# Patient Record
Sex: Female | Born: 1940 | Race: White | Hispanic: No | Marital: Single | Smoking: Never smoker
Health system: Southern US, Community
[De-identification: ages and names within clinical notes are randomized; demographics above are authoritative.]

## PROBLEM LIST (undated history)

## (undated) DIAGNOSIS — E079 Disorder of thyroid, unspecified: Secondary | ICD-10-CM

## (undated) DIAGNOSIS — E7800 Pure hypercholesterolemia, unspecified: Secondary | ICD-10-CM

## (undated) DIAGNOSIS — I10 Essential (primary) hypertension: Secondary | ICD-10-CM

---

## 2013-02-20 ENCOUNTER — Emergency Department: Payer: Self-pay | Admitting: Emergency Medicine

## 2013-09-06 ENCOUNTER — Ambulatory Visit: Payer: Self-pay | Admitting: Nephrology

## 2017-01-23 ENCOUNTER — Encounter: Payer: Self-pay | Admitting: *Deleted

## 2017-01-23 ENCOUNTER — Emergency Department
Admission: EM | Admit: 2017-01-23 | Discharge: 2017-01-23 | Disposition: A | Discharge status: Home / Self Care | Payer: Medicare Other | Attending: Emergency Medicine | Admitting: Emergency Medicine

## 2017-01-23 DIAGNOSIS — R030 Elevated blood-pressure reading, without diagnosis of hypertension: Secondary | ICD-10-CM

## 2017-01-23 DIAGNOSIS — Z0130 Encounter for examination of blood pressure without abnormal findings: Secondary | ICD-10-CM | POA: Diagnosis not present

## 2017-01-23 DIAGNOSIS — I10 Essential (primary) hypertension: Secondary | ICD-10-CM | POA: Insufficient documentation

## 2017-01-23 LAB — CBC
HCT: 43.3 % (ref 35.0–47.0)
Hemoglobin: 14.7 g/dL (ref 12.0–16.0)
MCH: 30.3 pg (ref 26.0–34.0)
MCHC: 34 g/dL (ref 32.0–36.0)
MCV: 88.9 fL (ref 80.0–100.0)
PLATELETS: 244 10*3/uL (ref 150–440)
RBC: 4.87 MIL/uL (ref 3.80–5.20)
RDW: 13.7 % (ref 11.5–14.5)
WBC: 7 10*3/uL (ref 3.6–11.0)

## 2017-01-23 LAB — BASIC METABOLIC PANEL
ANION GAP: 10 (ref 5–15)
BUN: 20 mg/dL (ref 6–20)
CALCIUM: 9.7 mg/dL (ref 8.9–10.3)
CO2: 28 mmol/L (ref 22–32)
CREATININE: 1.1 mg/dL — AB (ref 0.44–1.00)
Chloride: 95 mmol/L — ABNORMAL LOW (ref 101–111)
GFR, EST AFRICAN AMERICAN: 55 mL/min — AB (ref >60)
GFR, EST NON AFRICAN AMERICAN: 48 mL/min — AB (ref >60)
GLUCOSE: 170 mg/dL — AB (ref 65–99)
Potassium: 4.8 mmol/L (ref 3.5–5.1)
Sodium: 133 mmol/L — ABNORMAL LOW (ref 135–145)

## 2017-01-23 LAB — TROPONIN I

## 2017-01-23 NOTE — ED Triage Notes (Signed)
Patient states that she has a history of hypertension and has been taking her medications as prescribed. Patient states that over the past 2 days her bp has been 160's/90's.

## 2017-01-23 NOTE — ED Provider Notes (Signed)
[facility] Emergency Department Provider Note       Time seen: ----------------------------------------- 9:17 PM on 01/23/2017 -----------------------------------------     I have reviewed the triage vital signs and the nursing notes.   HISTORY   Chief Complaint Hypertension    HPI Tammy Morgan is a 76 y.o. female who presents to the ED for high blood pressure since chest today. Patient has been taking her blood pressure medications as prescribed. Patient states she is not sure why she took her blood pressure but when she did at home it was noted to be elevated. She denies fevers, chills, chest pain, shortness of breath, headache, vomiting or diarrhea.   No past medical history on file.  There are no active problems to display for this patient.   No past surgical history on file.  Allergies Penicillins and Sulfur  Social History Social History  Substance Use Topics  . Smoking status: Never Smoker  . Smokeless tobacco: Never Used  . Alcohol use No    Review of Systems Constitutional: Negative for fever. Eyes: Negative for vision changes ENT:  Negative for congestion, sore throat Cardiovascular: Negative for chest pain. Respiratory: Negative for shortness of breath. Gastrointestinal: Negative for abdominal pain, vomiting and diarrhea. Genitourinary: Negative for dysuria. Musculoskeletal: Negative for back pain. Skin: Negative for rash. Neurological: Negative for headaches, focal weakness or numbness.  All systems negative/normal/unremarkable except as stated in the HPI  ____________________________________________   PHYSICAL EXAM:  VITAL SIGNS: ED Triage Vitals [01/23/17 1927]  Enc Vitals Group     BP (!) 177/57     Pulse Rate 67     Resp 18     Temp 98.3 F (36.8 C)     Temp Source Oral     SpO2 98 %     Weight 167 lb (75.8 kg)     Height 5\' 3"  (1.6 m)     Head Circumference      Peak Flow      Pain Score      Pain  Loc      Pain Edu?      Excl. in GC?     Constitutional: Alert and oriented. Well appearing and in no distress. Eyes: Conjunctivae are normal. Normal extraocular movements. ENT   Head: Normocephalic and atraumatic.   Nose: No congestion/rhinnorhea.   Mouth/Throat: Mucous membranes are moist.   Neck: No stridor. Cardiovascular: Normal rate, regular rhythm. No murmurs, rubs, or gallops. Respiratory: Normal respiratory effort without tachypnea nor retractions. Breath sounds are clear and equal bilaterally. No wheezes/rales/rhonchi. Gastrointestinal: Soft and nontender. Normal bowel sounds Musculoskeletal: Nontender with normal range of motion in extremities. No lower extremity tenderness nor edema. Neurologic:  Normal speech and language. No gross focal neurologic deficits are appreciated.  Skin:  Skin is warm, dry and intact. No rash noted. Psychiatric: Mood and affect are normal. Speech and behavior are normal.  ____________________________________________  EKG: Interpreted by me. Sinus bradycardia with rate of 59 bpm, normal PR interval, normal QRS, normal QT.  ____________________________________________  ED COURSE:  Pertinent labs & imaging results that were available during my care of the patient were reviewed by me and considered in my medical decision making (see chart for details). Patient presents for hypertension, we will assess with labs and imaging as indicated.   Procedures ____________________________________________   LABS (pertinent positives/negatives)  Labs Reviewed  BASIC METABOLIC PANEL - Abnormal; Notable for the following:       Result Value   Sodium  133 (*)    Chloride 95 (*)    Glucose, Bld 170 (*)    Creatinine, Ser 1.10 (*)    GFR calc non Af Amer 48 (*)    GFR calc Af Amer 55 (*)    All other components within normal limits  CBC  TROPONIN I   ____________________________________________  FINAL ASSESSMENT AND  PLAN  Hypertension  Plan: Patient's labs and imaging were dictated above. Patient had presented for high blood pressure which may be blood pressure cuff related. Currently her blood pressure is unremarkable. We will encourage daily blood pressure checks and follow up with her doctor in 1 week.   Emily FilbertWilliams, Therman Hughlett E, MD   Note: This note was generated in part or whole with voice recognition software. Voice recognition is usually quite accurate but there are transcription errors that can and very often do occur. I apologize for any typographical errors that were not detected and corrected.     Emily FilbertWilliams, Asya Derryberry E, MD 01/23/17 2154

## 2017-01-23 NOTE — ED Notes (Signed)
ED Provider at bedside. 

## 2017-01-23 NOTE — ED Triage Notes (Signed)
Pt reports elevated blood pressure since yesterday.  Pt taking blood pressure meds.  Pt denies h/a, dizziness.  No chest pain or sob.  No n/v/d.  Pt alert   Speech clear.

## 2019-12-30 ENCOUNTER — Emergency Department: Payer: Medicare PPO

## 2019-12-30 ENCOUNTER — Other Ambulatory Visit: Payer: Self-pay

## 2019-12-30 ENCOUNTER — Encounter: Payer: Self-pay | Admitting: Emergency Medicine

## 2019-12-30 DIAGNOSIS — I10 Essential (primary) hypertension: Secondary | ICD-10-CM | POA: Diagnosis not present

## 2019-12-30 DIAGNOSIS — N390 Urinary tract infection, site not specified: Secondary | ICD-10-CM | POA: Insufficient documentation

## 2019-12-30 LAB — CBC
HCT: 40.6 % (ref 36.0–46.0)
Hemoglobin: 14.1 g/dL (ref 12.0–15.0)
MCH: 30.6 pg (ref 26.0–34.0)
MCHC: 34.7 g/dL (ref 30.0–36.0)
MCV: 88.1 fL (ref 80.0–100.0)
Platelets: 230 10*3/uL (ref 150–400)
RBC: 4.61 MIL/uL (ref 3.87–5.11)
RDW: 12.5 % (ref 11.5–15.5)
WBC: 5.3 10*3/uL (ref 4.0–10.5)
nRBC: 0 % (ref 0.0–0.2)

## 2019-12-30 LAB — BASIC METABOLIC PANEL
Anion gap: 10 (ref 5–15)
BUN: 15 mg/dL (ref 8–23)
CO2: 28 mmol/L (ref 22–32)
Calcium: 9.3 mg/dL (ref 8.9–10.3)
Chloride: 100 mmol/L (ref 98–111)
Creatinine, Ser: 1.1 mg/dL — ABNORMAL HIGH (ref 0.44–1.00)
GFR calc Af Amer: 55 mL/min — ABNORMAL LOW (ref >60)
GFR calc non Af Amer: 48 mL/min — ABNORMAL LOW (ref >60)
Glucose, Bld: 147 mg/dL — ABNORMAL HIGH (ref 70–99)
Potassium: 4 mmol/L (ref 3.5–5.1)
Sodium: 138 mmol/L (ref 135–145)

## 2019-12-30 LAB — TROPONIN I (HIGH SENSITIVITY): Troponin I (High Sensitivity): 6 ng/L (ref <18)

## 2019-12-30 NOTE — ED Triage Notes (Signed)
Patient states that she checked her blood pressure today and that it was elevated. Patient states that the first time she checked it the systolic was in the 170's. Patient states that she continued to check her blood pressure and her blood pressure was 204/103. Patient denies any chest pain, dizziness or headache.

## 2019-12-31 ENCOUNTER — Emergency Department
Admission: EM | Admit: 2019-12-31 | Discharge: 2019-12-31 | Disposition: A | Discharge status: Home / Self Care | Payer: Medicare PPO | Attending: Emergency Medicine | Admitting: Emergency Medicine

## 2019-12-31 DIAGNOSIS — I159 Secondary hypertension, unspecified: Secondary | ICD-10-CM

## 2019-12-31 DIAGNOSIS — N390 Urinary tract infection, site not specified: Secondary | ICD-10-CM

## 2019-12-31 HISTORY — DX: Disorder of thyroid, unspecified: E07.9

## 2019-12-31 HISTORY — DX: Pure hypercholesterolemia, unspecified: E78.00

## 2019-12-31 HISTORY — DX: Essential (primary) hypertension: I10

## 2019-12-31 LAB — TROPONIN I (HIGH SENSITIVITY)
Troponin I (High Sensitivity): 22 ng/L — ABNORMAL HIGH (ref <18)
Troponin I (High Sensitivity): 22 ng/L — ABNORMAL HIGH (ref <18)

## 2019-12-31 LAB — URINALYSIS, COMPLETE (UACMP) WITH MICROSCOPIC
Bacteria, UA: NONE SEEN
Bilirubin Urine: NEGATIVE
Glucose, UA: NEGATIVE mg/dL
Hgb urine dipstick: NEGATIVE
Ketones, ur: NEGATIVE mg/dL
Nitrite: NEGATIVE
Protein, ur: NEGATIVE mg/dL
Specific Gravity, Urine: 1.01 (ref 1.005–1.030)
pH: 7 (ref 5.0–8.0)

## 2019-12-31 LAB — T4, FREE: Free T4: 1.18 ng/dL — ABNORMAL HIGH (ref 0.61–1.12)

## 2019-12-31 LAB — TSH: TSH: 1.388 u[IU]/mL (ref 0.350–4.500)

## 2019-12-31 MED ORDER — FOSFOMYCIN TROMETHAMINE 3 G PO PACK
3.0000 g | PACK | Freq: Once | ORAL | Status: AC
  Administered 2019-12-31: 3 g via ORAL
  Filled 2019-12-31: qty 3

## 2019-12-31 NOTE — ED Notes (Signed)
Per previous shift multiple attempts were made to collect repeat troponin. This RN called lab to request collect.

## 2019-12-31 NOTE — Discharge Instructions (Addendum)
Stop your current diet plan.  Continue your medications as directed by your doctor.  Return to the ER for worsening symptoms, persistent vomiting, difficulty breathing or other concerns.

## 2019-12-31 NOTE — ED Notes (Signed)
Pt verbalized understanding of discharge instructions. NAD at this time. 

## 2019-12-31 NOTE — ED Notes (Signed)
Labs hand walked to lab by this RN in order to reduce delay of care.

## 2019-12-31 NOTE — ED Provider Notes (Signed)
[facility] Emergency Department Provider Note   ____________________________________________   First MD Initiated Contact with Patient 12/31/19 [phone]     (approximate)  I have reviewed the triage vital signs and the nursing notes.   HISTORY  Chief Complaint Hypertension    HPI Tammy Morgan is a 79 y.o. female who presents to the ED from home with a chief complaint of elevated blood pressure.  Patient has a history of hypertension, hypothyroidism, hyperlipidemia who has noted an increase of her blood pressure yesterday.  3 days ago she started the first part of Redux diet which consists of drinking 70+ ounces of water with salt added.  She felt a mild headache and checked her blood pressure which was 204/103.  Baseline 130-150s/80s.  No change in her antihypertensives recently.  She takes Metoprolol, Cardizem and Diovan.  Denies fever, cough, chest pain, shortness of breath, abdominal pain, nausea, vomiting or dizziness.       Past Medical History:  Diagnosis Date  . Hypercholesteremia   . Hypertension   . Thyroid disease     There are no problems to display for this patient.   History reviewed. No pertinent surgical history.  Prior to Admission medications   Not on File    Allergies Penicillins and Sulfur  No family history on file.  Social History Social History   Tobacco Use  . Smoking status: Never Smoker  . Smokeless tobacco: Never Used  Substance Use Topics  . Alcohol use: Yes  . Drug use: No    Review of Systems  Constitutional: Positive for elevated blood pressure.  No fever/chills Eyes: No visual changes. ENT: No sore throat. Cardiovascular: Denies chest pain. Respiratory: Denies shortness of breath. Gastrointestinal: No abdominal pain.  No nausea, no vomiting.  No diarrhea.  No constipation. Genitourinary: Negative for dysuria. Musculoskeletal: Negative for back pain. Skin: Negative for rash. Neurological: Positive  for headache. Negative for focal weakness or numbness.   ____________________________________________   PHYSICAL EXAM:  VITAL SIGNS: ED Triage Vitals  Enc Vitals Group     BP 12/30/19 2211 (!) 192/88     Pulse Rate 12/30/19 2211 76     Resp 12/30/19 2211 18     Temp 12/30/19 2211 97.7 F (36.5 C)     Temp Source 12/30/19 2211 Oral     SpO2 12/30/19 2211 98 %     Weight 12/30/19 2212 172 lb (78 kg)     Height 12/30/19 2212 5\' 3"  (1.6 m)     Head Circumference --      Peak Flow --      Pain Score 12/30/19 2212 0     Pain Loc --      Pain Edu? --      Excl. in GC? --     Constitutional: Alert and oriented. Well appearing and in no acute distress. Eyes: Conjunctivae are normal. PERRL. EOMI. Head: Atraumatic. Nose: No congestion/rhinnorhea. Mouth/Throat: Mucous membranes are moist.   Neck: No stridor.  No carotid bruits. Cardiovascular: Normal rate, regular rhythm. Grossly normal heart sounds.  Good peripheral circulation. Respiratory: Normal respiratory effort.  No retractions. Lungs CTAB. Gastrointestinal: Soft and nontender to light or deep palpation. No distention. No abdominal bruits. No CVA tenderness. Musculoskeletal: No lower extremity tenderness nor edema.  No joint effusions. Neurologic: Alert and oriented x3.  CN II to XII grossly intact.  Normal speech and language. No gross focal neurologic deficits are appreciated. No gait instability. Skin:  Skin is  warm, dry and intact. No rash noted. Psychiatric: Mood and affect are normal. Speech and behavior are normal.  ____________________________________________   LABS (all labs ordered are listed, but only abnormal results are displayed)  Labs Reviewed  BASIC METABOLIC PANEL - Abnormal; Notable for the following components:      Result Value   Glucose, Bld 147 (*)    Creatinine, Ser 1.10 (*)    GFR calc non Af Amer 48 (*)    GFR calc Af Amer 55 (*)    All other components within normal limits  URINALYSIS,  COMPLETE (UACMP) WITH MICROSCOPIC - Abnormal; Notable for the following components:   Leukocytes,Ua TRACE (*)    All other components within normal limits  T4, FREE - Abnormal; Notable for the following components:   Free T4 1.18 (*)    All other components within normal limits  TROPONIN I (HIGH SENSITIVITY) - Abnormal; Notable for the following components:   Troponin I (High Sensitivity) 22 (*)    All other components within normal limits  CBC  TSH  TROPONIN I (HIGH SENSITIVITY)  TROPONIN I (HIGH SENSITIVITY)   ____________________________________________  EKG  ED ECG REPORT I, Fabian Walder J, the attending physician, personally viewed and interpreted this ECG.   Date: 12/31/2019  EKG Time: 2218  Rate: 61  Rhythm: normal EKG, normal sinus rhythm  Axis: Normal  Intervals:none  ST&T Change: Nonspecific  ____________________________________________  RADIOLOGY  ED MD interpretation: No acute cardiopulmonary process  Official radiology report(s): DG Chest 2 View  Result Date: 12/30/2019 CLINICAL DATA:  Hypertension. EXAM: CHEST - 2 VIEW COMPARISON:  None. FINDINGS: The cardiomediastinal contours are normal. Mild aortic arch atherosclerosis. The lungs are clear. Pulmonary vasculature is normal. No consolidation, pleural effusion, or pneumothorax. No acute osseous abnormalities are seen. IMPRESSION: No acute chest findings. Aortic Atherosclerosis (ICD10-I70.0). Electronically Signed   By: Narda Rutherford M.D.   On: 12/30/2019 23:52    ____________________________________________   PROCEDURES  Procedure(s) performed (including Critical Care):  Procedures   ____________________________________________   INITIAL IMPRESSION / ASSESSMENT AND PLAN / ED COURSE  As part of my medical decision making, I reviewed the following data within the electronic MEDICAL RECORD NUMBER History obtained from family, Nursing notes reviewed and incorporated, Labs reviewed, EKG interpreted, Old  chart reviewed, Radiograph reviewed and Notes from prior ED visits     Tammy Morgan was evaluated in Emergency Department on 12/31/2019 for the symptoms described in the history of present illness. She was evaluated in the context of the global COVID-19 pandemic, which necessitated consideration that the patient might be at risk for infection with the SARS-CoV-2 virus that causes COVID-19. Institutional protocols and algorithms that pertain to the evaluation of patients at risk for COVID-19 are in a state of rapid change based on information released by regulatory bodies including the CDC and federal and state organizations. These policies and algorithms were followed during the patient's care in the ED.    78 year old female presenting with elevated blood pressure since starting new diet consisting of salt water.  Differential diagnosis includes but is not limited to metabolic, infectious, dietary etiologies, etc.  Laboratory, imaging, EKG unremarkable.  Blood pressure currently 185/80 without symptoms of headache, chest pain, shortness of breath, nausea/vomiting or dizziness.  Awaiting UA results.  Patient would also like her thyroid panel checked while she is here.  Cautioned her against continuing with this diet, particularly as the second part of it requires her taking supplements which contain phentermine.  Clinical Course as of Dec 30 652  Sun Dec 31, 2019  0646 Updated patient on repeat troponin and UA results. Patient denies ever having chest pain.  While troponin has slightly elevated, this is clinically insignificant and likely secondary to hypertension.  Will repeat third troponin and discharge home if stable.  Fosfomycin given for trace leukocytes in urine.  Care will be transferred to the oncoming provider pending repeat troponin result.   [JS]    Clinical Course User Index [JS] Paulette Blanch, MD     ____________________________________________   FINAL CLINICAL IMPRESSION(S) /  ED DIAGNOSES  Final diagnoses:  Secondary hypertension  Urinary tract infection without hematuria, site unspecified     ED Discharge Orders    None       Note:  This document was prepared using Dragon voice recognition software and may include unintentional dictation errors.   Paulette Blanch, MD 12/31/19 [phone]

## 2021-11-20 IMAGING — CR DG CHEST 2V
1 series · 2 of 2 positions shown · non-contrast
Comparison: None.

CLINICAL DATA: Hypertension.

EXAM:
CHEST - 2 VIEW

[Series 1: dg chest 2 view · 0.14mm/px · 2 of 2 slices shown]
[im 1/2]
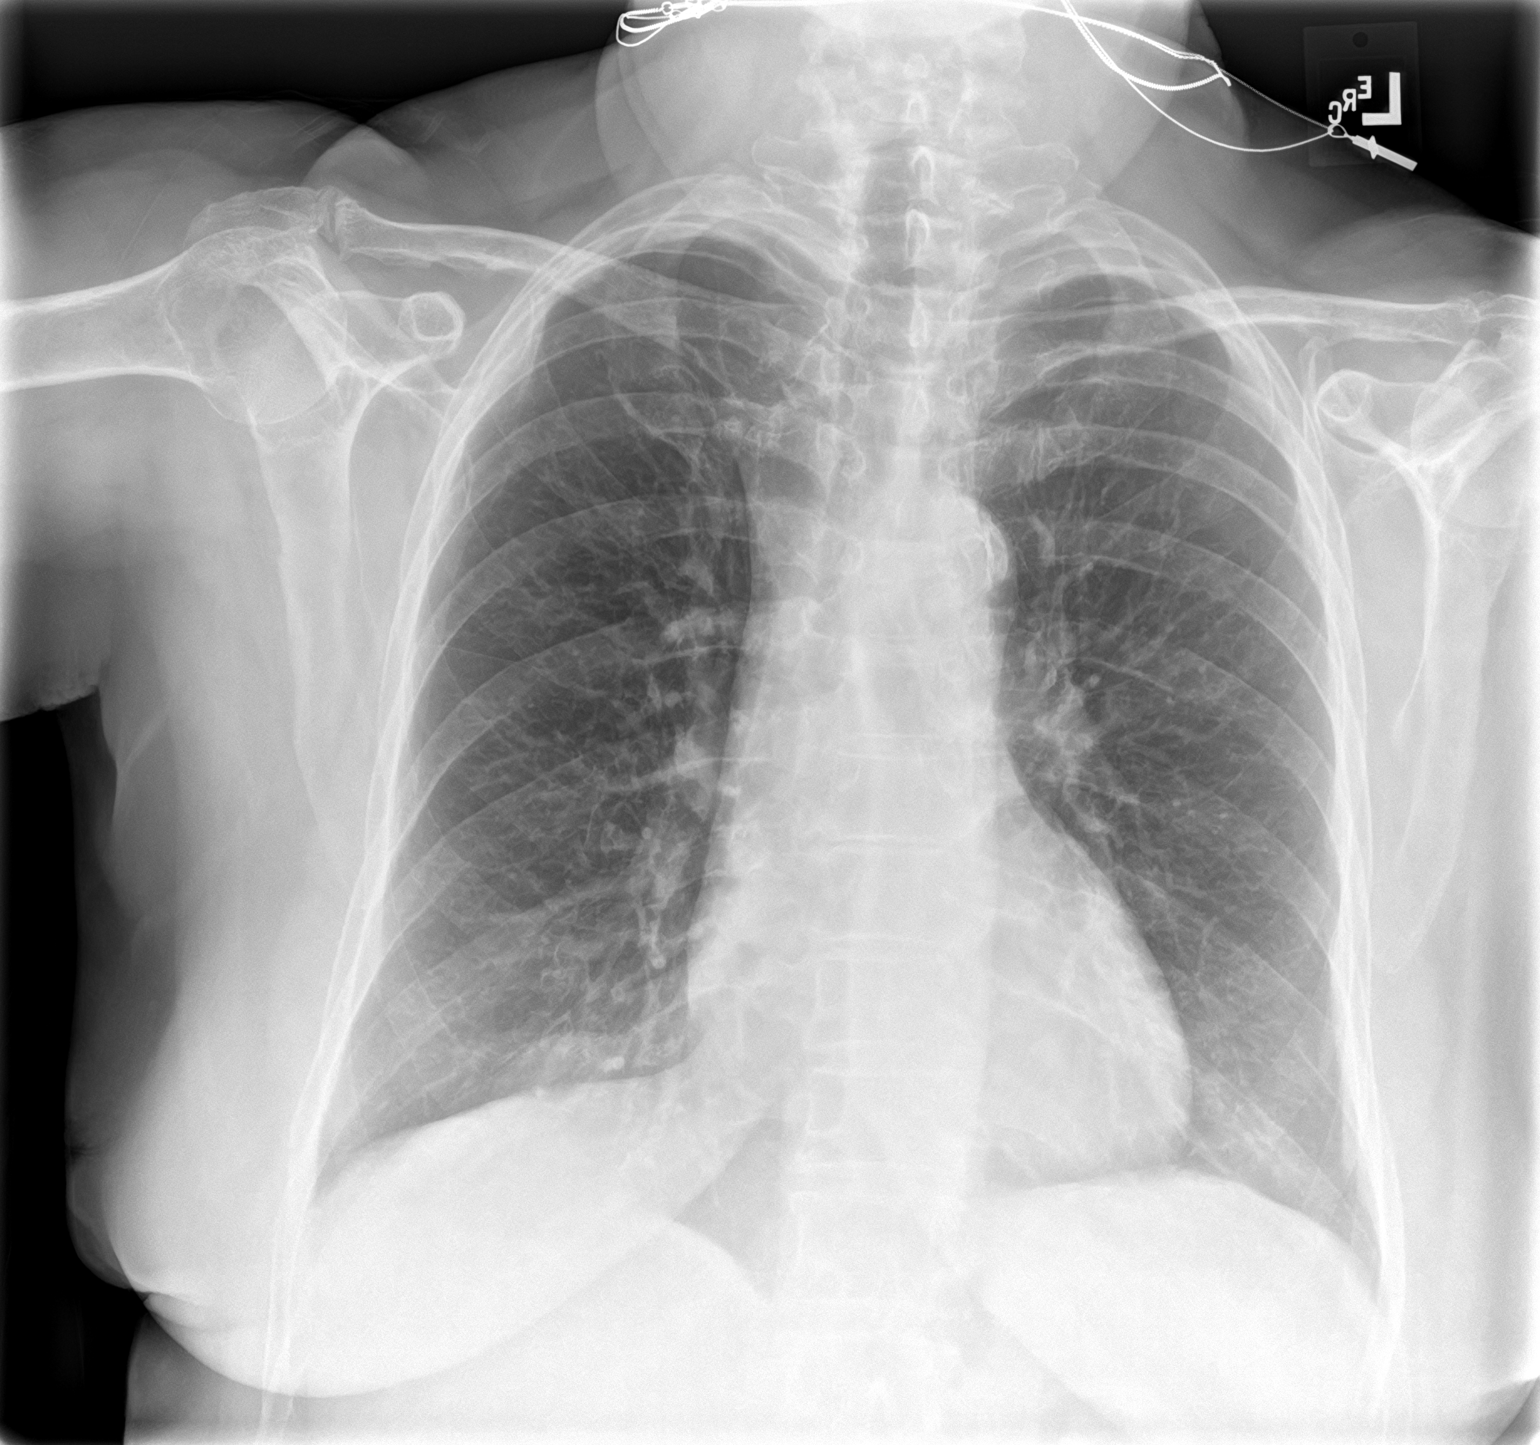
[im 2/2]
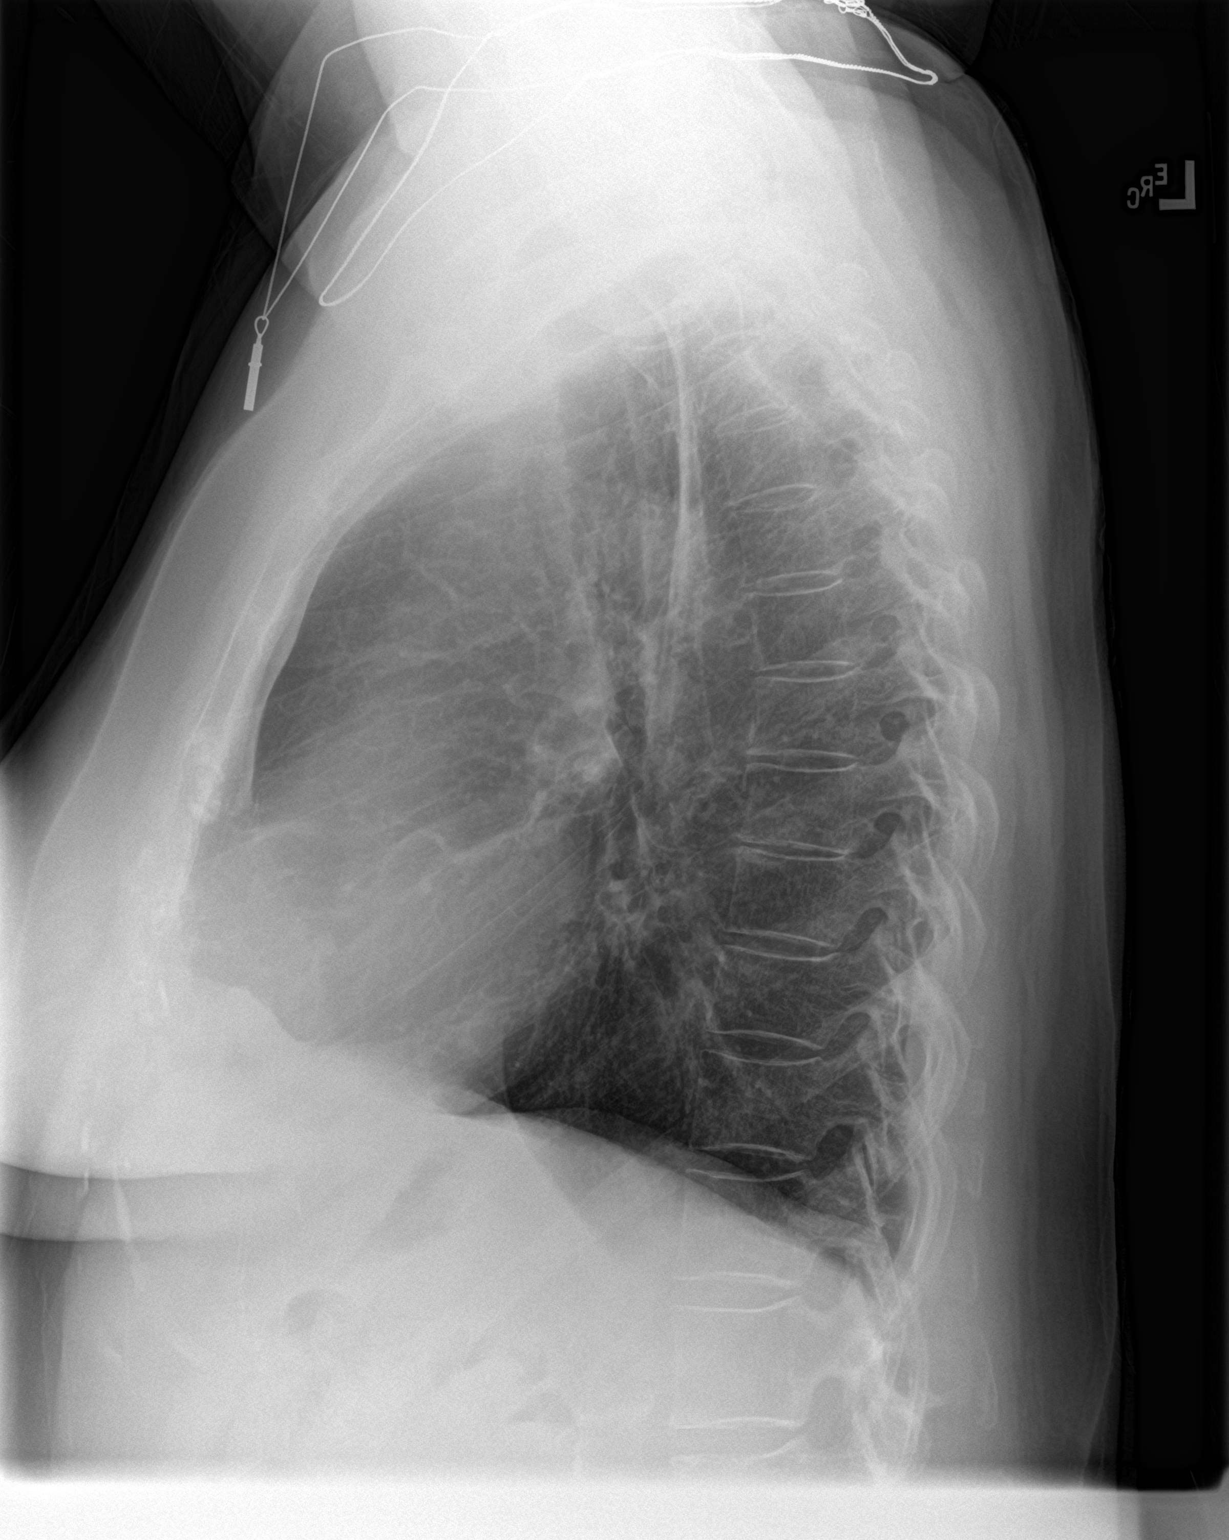

[2 of 2 positions shown; findings below may reference images not displayed]

FINDINGS: The cardiomediastinal contours are normal. Mild aortic arch
atherosclerosis. The lungs are clear. Pulmonary vasculature is
normal. No consolidation, pleural effusion, or pneumothorax. No
acute osseous abnormalities are seen.
IMPRESSION: No acute chest findings.

Aortic Atherosclerosis ([id number]).

## 2022-01-01 ENCOUNTER — Ambulatory Visit (LOCAL_COMMUNITY_HEALTH_CENTER): Payer: Medicare PPO

## 2022-01-01 DIAGNOSIS — Z23 Encounter for immunization: Secondary | ICD-10-CM

## 2022-01-01 NOTE — Progress Notes (Signed)
  Are you feeling sick today? No   Have you ever received a dose of COVID-19 Vaccine? [employer], [place], [place], [place], Other) Yes  If yes, which vaccine and how many doses?   Pfizer and 5 doses   Did you bring the vaccination record card or other documentation?  Yes   Do you have a health condition or are undergoing treatment that makes you moderately or severely immunocompromised? This would include, but not be limited to: cancer, HIV, organ transplant, immunosuppressive therapy/high-dose corticosteroids, or moderate/severe primary immunodeficiency.  No  Have you received COVID-19 vaccine before or during hematopoietic cell transplant (HCT) or CAR-T-cell therapies? No  Have you ever had an allergic reaction to: (This would include a severe allergic reaction or a reaction that caused hives, swelling, or respiratory distress, including wheezing.) A component of a COVID-19 vaccine or a previous dose of COVID-19 vaccine? No   Have you ever had an allergic reaction to another vaccine (other thanCOVID-19 vaccine) or an injectable medication? (This would include a severe allergic reaction or a reaction that caused hives, swelling, or respiratory distress, including wheezing.)   No    Do you have a history of any of the following:  Myocarditis or Pericarditis No  Dermal fillers:  No  Multisystem Inflammatory Syndrome (MIS-C or MIS-A)? No  COVID-19 disease within the past 3 months? No  Vaccinated with monkeypox vaccine in the last 4 weeks? 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 Tolerated vaccine well. Updated Covid vaccine card and [facility] copy given. Jerel Shepherd, RN

## 2022-06-03 ENCOUNTER — Ambulatory Visit (LOCAL_COMMUNITY_HEALTH_CENTER): Payer: Medicare PPO

## 2022-06-03 DIAGNOSIS — Z23 Encounter for immunization: Secondary | ICD-10-CM

## 2022-06-03 DIAGNOSIS — Z719 Counseling, unspecified: Secondary | ICD-10-CM

## 2022-06-03 NOTE — Progress Notes (Signed)
  Are you feeling sick today? No   Have you ever received a dose of COVID-19 Vaccine? [employer], [place], [place], [place], Other) Yes  If yes, which vaccine and how many doses?   PFIZER, 6   Did you bring the vaccination record card or other documentation?  Yes   Do you have a health condition or are undergoing treatment that makes you moderately or severely immunocompromised? This would include, but not be limited to: cancer, HIV, organ transplant, immunosuppressive therapy/high-dose corticosteroids, or moderate/severe primary immunodeficiency.  No  Have you received COVID-19 vaccine before or during hematopoietic cell transplant (HCT) or CAR-T-cell therapies? No  Have you ever had an allergic reaction to: (This would include a severe allergic reaction or a reaction that caused hives, swelling, or respiratory distress, including wheezing.) A component of a COVID-19 vaccine or a previous dose of COVID-19 vaccine?  Pt states had hives low allergic reaction to Sulfur.   Have you ever had an allergic reaction to another vaccine (other thanCOVID-19 vaccine) or an injectable medication? (This would include a severe allergic reaction or a reaction that caused hives, swelling, or respiratory distress, including wheezing.)   No    Do you have a history of any of the following:  Myocarditis or Pericarditis No  Dermal fillers:  No  Multisystem Inflammatory Syndrome (MIS-C or MIS-A)? No  COVID-19 disease within the past 3 months? No  Vaccinated with monkeypox vaccine in the last 4 weeks? No  Eligible, administered covid vaccine, monitored, tolerated well. Provided covid record card, VIS, and [facility] copy. M.Oreoluwa Aigner, LPN.

## 2023-06-18 ENCOUNTER — Ambulatory Visit: Payer: Medicare PPO
